# Patient Record
Sex: Male | Born: 2000 | Race: White | Hispanic: No | Marital: Single | Smoking: Never smoker
Health system: Southern US, Community
[De-identification: ages and names within clinical notes are randomized; demographics above are authoritative.]

## PROBLEM LIST (undated history)

## (undated) DIAGNOSIS — J45909 Unspecified asthma, uncomplicated: Secondary | ICD-10-CM

## (undated) HISTORY — PX: ADENOIDECTOMY: SUR15

## (undated) HISTORY — PX: TYMPANOSTOMY TUBE PLACEMENT: SHX32

## (undated) HISTORY — PX: TONSILLECTOMY: SUR1361

---
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
## 2021-02-22 ENCOUNTER — Ambulatory Visit (INDEPENDENT_AMBULATORY_CARE_PROVIDER_SITE_OTHER): Payer: 59

## 2021-02-22 ENCOUNTER — Ambulatory Visit
Admission: EM | Admit: 2021-02-22 | Discharge: 2021-02-22 | Disposition: A | Discharge status: Home / Self Care | Payer: 59 | Attending: Family Medicine | Admitting: Family Medicine

## 2021-02-22 ENCOUNTER — Other Ambulatory Visit: Payer: Self-pay

## 2021-02-22 ENCOUNTER — Encounter: Payer: Self-pay | Admitting: Emergency Medicine

## 2021-02-22 DIAGNOSIS — S62320A Displaced fracture of shaft of second metacarpal bone, right hand, initial encounter for closed fracture: Secondary | ICD-10-CM | POA: Diagnosis not present

## 2021-02-22 MED ORDER — NAPROXEN 500 MG PO TABS: 500.0000 mg | ORAL_TABLET | Freq: Two times a day (BID) | ORAL | 0 refills | Status: AC

## 2021-02-22 NOTE — ED Triage Notes (Signed)
Foul ball hit right hand in glove yesterday.

## 2021-02-22 NOTE — ED Provider Notes (Signed)
[facility]    CSN: [id number] Arrival date & time: 02/22/21  0801      History   Chief Complaint Chief Complaint  Patient presents with   Hand Pain    right    HPI Kenneth Conley is a 20 y.o. male.   Patient presenting today with acute right hand pain, swelling, decreased range of motion below index finger after a fall ball hit his hand inside of the baseball glove last night.  Denies discoloration, numbness, tingling, radiation of pain.  So far has been icing the area and taking over-the-counter pain relievers with minimal relief.  No past history of orthopedic issues to the hand.   Past Medical History:  Diagnosis Date   Asthma     There are no problems to display for this patient.   Past Surgical History:  Procedure Laterality Date   ADENOIDECTOMY     TONSILLECTOMY     TYMPANOSTOMY TUBE PLACEMENT Bilateral        Home Medications    Prior to Admission medications   Medication Sig Start Date End Date Taking? Authorizing Provider  naproxen (NAPROSYN) 500 MG tablet Take 1 tablet (500 mg total) by mouth 2 (two) times daily. 02/22/21  Yes Particia Nearing, PA-C  meloxicam (MOBIC) 7.5 MG tablet Take 1 tablet (7.5 mg total) by mouth daily. Take with food 04/18/18   Ovid Curd P, PA-C  tiZANidine (ZANAFLEX) 4 MG capsule Take 1 capsule (4 mg total) by mouth 3 (three) times daily. 04/18/18   Lutricia Feil, PA-C    Family History Family History  Problem Relation Age of Onset   Healthy Mother    Healthy Father     Social History Social History   Tobacco Use   Smoking status: Never   Smokeless tobacco: Never  Substance Use Topics   Alcohol use: No   Drug use: No     Allergies   Peanut-containing drug products   Review of Systems Review of Systems Per HPI  Physical Exam Triage Vital Signs ED Triage Vitals  Enc Vitals Group     BP 02/22/21 0810 121/60     Pulse Rate 02/22/21 0810 75     Resp 02/22/21 0810 18     Temp  02/22/21 0810 97.9 F (36.6 C)     Temp Source 02/22/21 0810 Oral     SpO2 02/22/21 0810 97 %     Weight 02/22/21 0810 180 lb (81.6 kg)     Height --      Head Circumference --      Peak Flow --      Pain Score 02/22/21 0809 7     Pain Loc --      Pain Edu? --      Excl. in GC? --    No data found.  Updated Vital Signs BP 121/60 (BP Location: Right Arm)   Pulse 75   Temp 97.9 F (36.6 C) (Oral)   Resp 18   Wt 180 lb (81.6 kg)   SpO2 97%   Visual Acuity Right Eye Distance:   Left Eye Distance:   Bilateral Distance:    Right Eye Near:   Left Eye Near:    Bilateral Near:     Physical Exam Vitals and nursing note reviewed.  Constitutional:      Appearance: Normal appearance.  HENT:     Head: Atraumatic.  Eyes:     Extraocular Movements: Extraocular movements intact.  Conjunctiva/sclera: Conjunctivae normal.  Cardiovascular:     Rate and Rhythm: Normal rate and regular rhythm.  Pulmonary:     Effort: Pulmonary effort is normal.     Breath sounds: Normal breath sounds.  Musculoskeletal:        General: Swelling, tenderness and signs of injury present. No deformity. Normal range of motion.     Cervical back: Normal range of motion and neck supple.     Comments: Significant edema focally over right second metacarpal, severely tender to palpation in this area so exam limited.  Good range of motion in 4-5 fingers right hand, sparing the right index finger  Skin:    General: Skin is warm and dry.     Findings: No erythema.  Neurological:     General: No focal deficit present.     Mental Status: He is oriented to person, place, and time.     Comments: Right hand neurovascularly intact  Psychiatric:        Mood and Affect: Mood normal.        Thought Content: Thought content normal.        Judgment: Judgment normal.     UC Treatments / Results  Labs (all labs ordered are listed, but only abnormal results are displayed) Labs Reviewed - No data to  display  EKG   Radiology DG Hand Complete Right  Result Date: 02/22/2021 CLINICAL DATA:  Status post trauma to the right second metacarpal yesterday with pain. EXAM: RIGHT HAND - COMPLETE 3+ VIEW COMPARISON:  October 31, 2014 FINDINGS: Mild displaced fracture of the mid second metacarpal shaft is noted. There is no evidence of arthropathy or other focal bone abnormality. Soft tissues are unremarkable. IMPRESSION: Mild displaced fracture of the mid second metacarpal shaft. Electronically Signed   By: Sherian Rein M.D.   On: 02/22/2021 08:27    Procedures Procedures (including critical care time)  Medications Ordered in UC Medications - No data to display  Initial Impression / Assessment and Plan / UC Course  I have reviewed the triage vital signs and the nursing notes.  Pertinent labs & imaging results that were available during my care of the patient were reviewed by me and considered in my medical decision making (see chart for details).     X-ray right hand showing a mildly displaced fracture of the shaft of the second metacarpal of his right hand.  Radial gutter splint placed today, naproxen sent and RICE recall reviewed.  Follow-up with orthopedics first thing next week.  Return for worsening symptoms at any time.  Final Clinical Impressions(s) / UC Diagnoses   Final diagnoses:  Displaced fracture of shaft of second metacarpal bone, right hand, initial encounter for closed fracture   Discharge Instructions   None    ED Prescriptions     Medication Sig Dispense Auth. Provider   naproxen (NAPROSYN) 500 MG tablet Take 1 tablet (500 mg total) by mouth 2 (two) times daily. 30 tablet Particia Nearing, [place]      PDMP not reviewed this encounter.   Particia Nearing, [place] 02/22/21 [phone]

## 2021-03-31 ENCOUNTER — Encounter: Payer: Self-pay | Admitting: Emergency Medicine

## 2021-03-31 ENCOUNTER — Ambulatory Visit
Admission: EM | Admit: 2021-03-31 | Discharge: 2021-03-31 | Disposition: A | Discharge status: Home / Self Care | Payer: 59 | Attending: Emergency Medicine | Admitting: Emergency Medicine

## 2021-03-31 DIAGNOSIS — B349 Viral infection, unspecified: Secondary | ICD-10-CM | POA: Diagnosis not present

## 2021-03-31 NOTE — ED Triage Notes (Signed)
Pt here with flu-like sx since yesterday.  

## 2021-03-31 NOTE — ED Provider Notes (Signed)
Kenneth Conley    CSN: [id number] Arrival date & time: 03/31/21  1536      History   Chief Complaint Chief Complaint  Patient presents with   Headache   Fever   Sore Throat     HPI Kenneth Conley is a 20 y.o. male.  Patient presents with chills, body aches, sore throat, cough since yesterday.  Treatment at home with ibuprofen.  He denies fever, rash, wheezing, shortness of breath, vomiting, diarrhea, or other symptoms.  His medical history includes asthma.  The history is provided by the patient.   Past Medical History:  Diagnosis Date   Asthma     There are no problems to display for this patient.   Past Surgical History:  Procedure Laterality Date   ADENOIDECTOMY     TONSILLECTOMY     TYMPANOSTOMY TUBE PLACEMENT Bilateral        Home Medications    Prior to Admission medications   Medication Sig Start Date End Date Taking? Authorizing Provider  meloxicam (MOBIC) 7.5 MG tablet Take 1 tablet (7.5 mg total) by mouth daily. Take with food 04/18/18   Ovid Curd P, PA-C  naproxen (NAPROSYN) 500 MG tablet Take 1 tablet (500 mg total) by mouth 2 (two) times daily. 02/22/21   Particia Nearing, PA-C  tiZANidine (ZANAFLEX) 4 MG capsule Take 1 capsule (4 mg total) by mouth 3 (three) times daily. 04/18/18   Lutricia Feil, PA-C    Family History Family History  Problem Relation Age of Onset   Healthy Mother    Healthy Father     Social History Social History   Tobacco Use   Smoking status: Never   Smokeless tobacco: Never  Substance Use Topics   Alcohol use: No   Drug use: No     Allergies   Peanut-containing drug products   Review of Systems Review of Systems  Constitutional:  Positive for chills. Negative for fever.  HENT:  Positive for sore throat. Negative for ear pain.   Respiratory:  Positive for cough. Negative for shortness of breath and wheezing.   Gastrointestinal:  Negative for diarrhea and vomiting.  Skin:   Negative for color change and rash.  All other systems reviewed and are negative.   Physical Exam Triage Vital Signs ED Triage Vitals  Enc Vitals Group     BP      Pulse      Resp      Temp      Temp src      SpO2      Weight      Height      Head Circumference      Peak Flow      Pain Score      Pain Loc      Pain Edu?      Excl. in GC?    No data found.  Updated Vital Signs BP 134/77   Pulse (!) 104   Temp 98.8 F (37.1 C)   Resp 20   SpO2 98%   Visual Acuity Right Eye Distance:   Left Eye Distance:   Bilateral Distance:    Right Eye Near:   Left Eye Near:    Bilateral Near:     Physical Exam Vitals and nursing note reviewed.  Constitutional:      General: He is not in acute distress.    Appearance: He is well-developed.  HENT:     Head: Normocephalic and atraumatic.  Right Ear: Tympanic membrane normal.     Left Ear: Tympanic membrane normal.     Nose: Nose normal.     Mouth/Throat:     Mouth: Mucous membranes are moist.     Pharynx: Oropharynx is clear.  Eyes:     Conjunctiva/sclera: Conjunctivae normal.  Cardiovascular:     Rate and Rhythm: Normal rate and regular rhythm.     Heart sounds: Normal heart sounds.  Pulmonary:     Effort: Pulmonary effort is normal. No respiratory distress.     Breath sounds: Normal breath sounds.  Abdominal:     Palpations: Abdomen is soft.     Tenderness: There is no abdominal tenderness.  Musculoskeletal:     Cervical back: Neck supple.  Skin:    General: Skin is warm and dry.  Neurological:     Mental Status: He is alert.  Psychiatric:        Mood and Affect: Mood normal.        Behavior: Behavior normal.     UC Treatments / Results  Labs (all labs ordered are listed, but only abnormal results are displayed) Labs Reviewed  NOVEL CORONAVIRUS, NAA  POCT INFLUENZA A/B    EKG   Radiology No results found.  Procedures Procedures (including critical care time)  Medications Ordered in  UC Medications - No data to display  Initial Impression / Assessment and Plan / UC Course  I have reviewed the triage vital signs and the nursing notes.  Pertinent labs & imaging results that were available during my care of the patient were reviewed by me and considered in my medical decision making (see chart for details).   Viral illness.  Rapid flu negative.  COVID pending.  Instructed patient to self quarantine per CDC guidelines.  Discussed symptomatic treatment including Tylenol or ibuprofen, rest, hydration.  Instructed patient to follow up with PCP if symptoms are not improving.  Patient agrees to plan of care.    Final Clinical Impressions(s) / UC Diagnoses   Final diagnoses:  Viral illness     Discharge Instructions      Your flu test is negative.  Your COVID test is pending.  You should self quarantine until the test result is back.    Take Tylenol or ibuprofen as needed for fever or discomfort.  Rest and keep yourself hydrated.    Follow-up with your primary care provider if your symptoms are not improving.         ED Prescriptions   None    PDMP not reviewed this encounter.   Mickie Bail, NP 03/31/21 1747

## 2021-03-31 NOTE — Discharge Instructions (Addendum)
Your flu test is negative.  Your COVID test is pending.  You should self quarantine until the test result is back.   ° °Take Tylenol or ibuprofen as needed for fever or discomfort.  Rest and keep yourself hydrated.   ° °Follow-up with your primary care provider if your symptoms are not improving.   ° ° °

## 2021-04-01 LAB — NOVEL CORONAVIRUS, NAA: SARS-CoV-2, NAA: NOT DETECTED

## 2021-04-01 LAB — SARS-COV-2, NAA 2 DAY TAT

## 2022-02-02 IMAGING — DX DG HAND COMPLETE 3+V*R*
3 series · 3 of 3 positions shown · non-contrast
Comparison: October 31, 2014

CLINICAL DATA: Status post trauma to the right second metacarpal
yesterday with pain.

EXAM:
RIGHT HAND - COMPLETE 3+ VIEW

[hand pa]
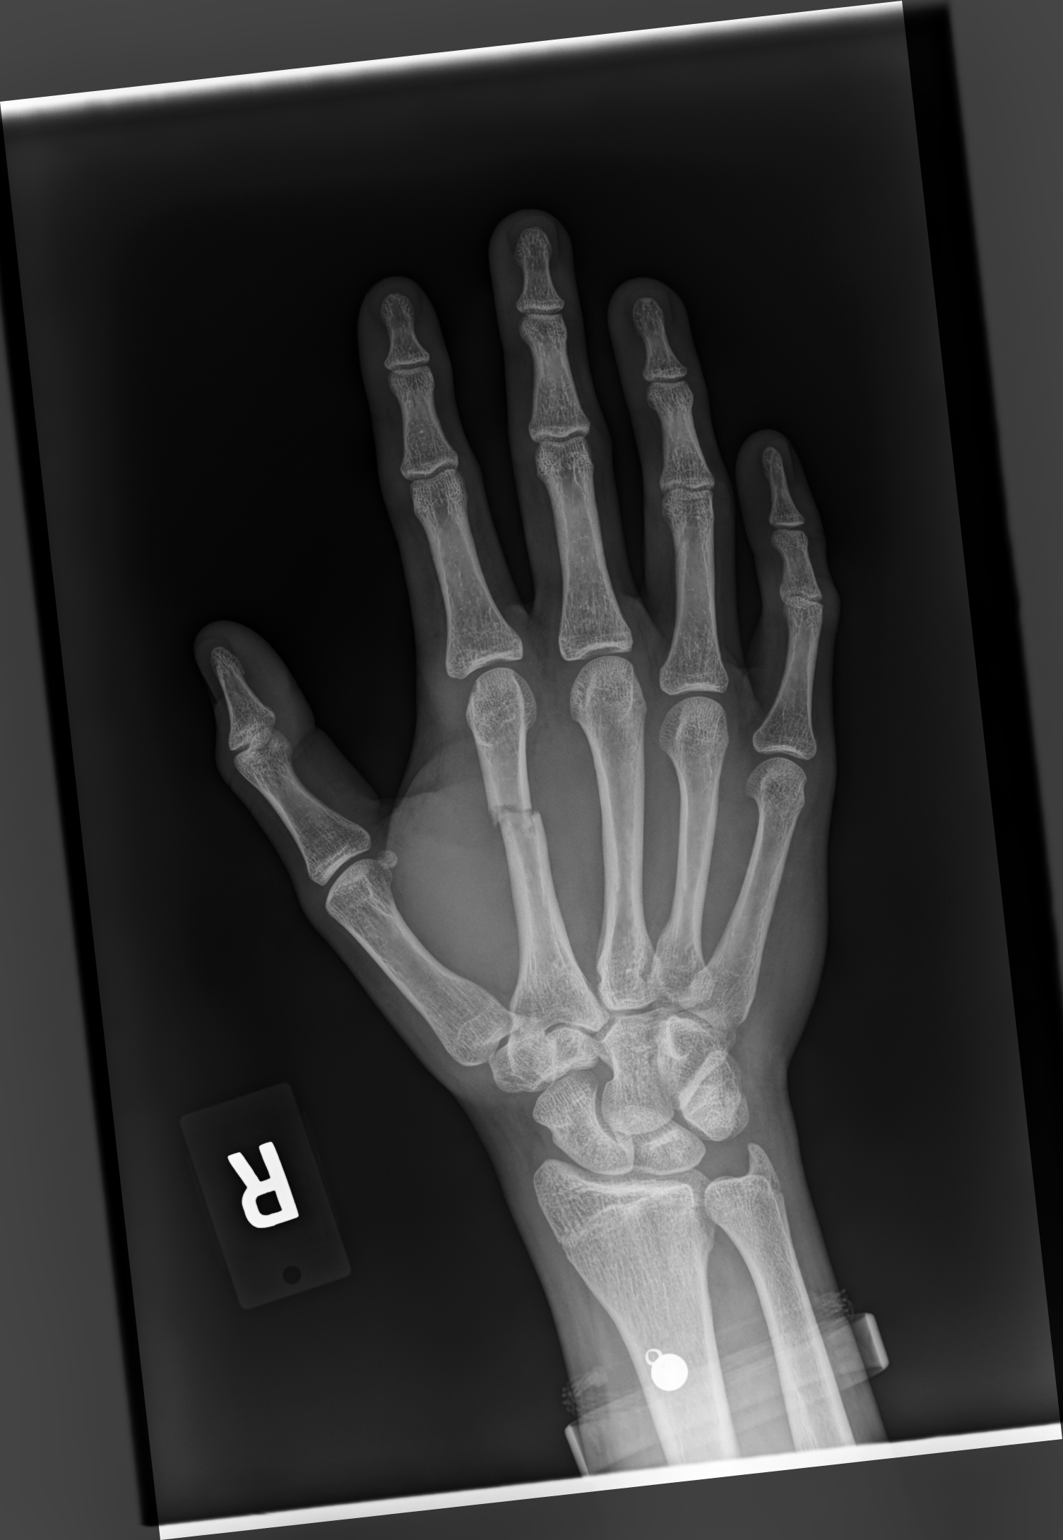

[hand mlo]
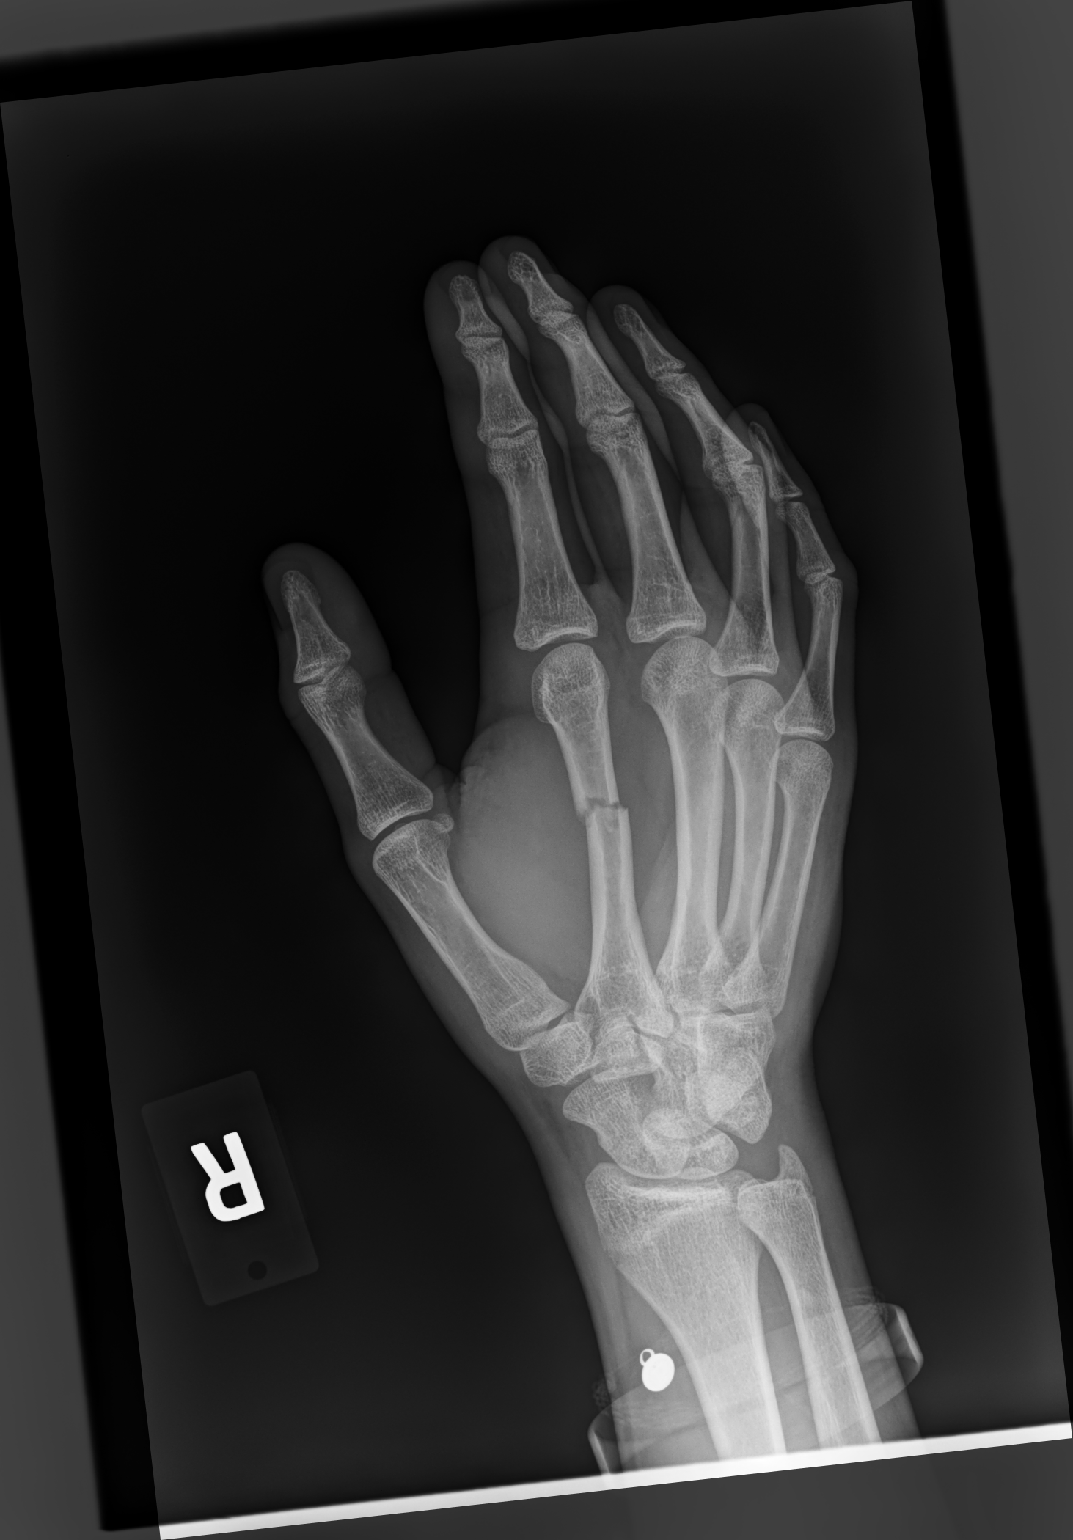

[hand lat]
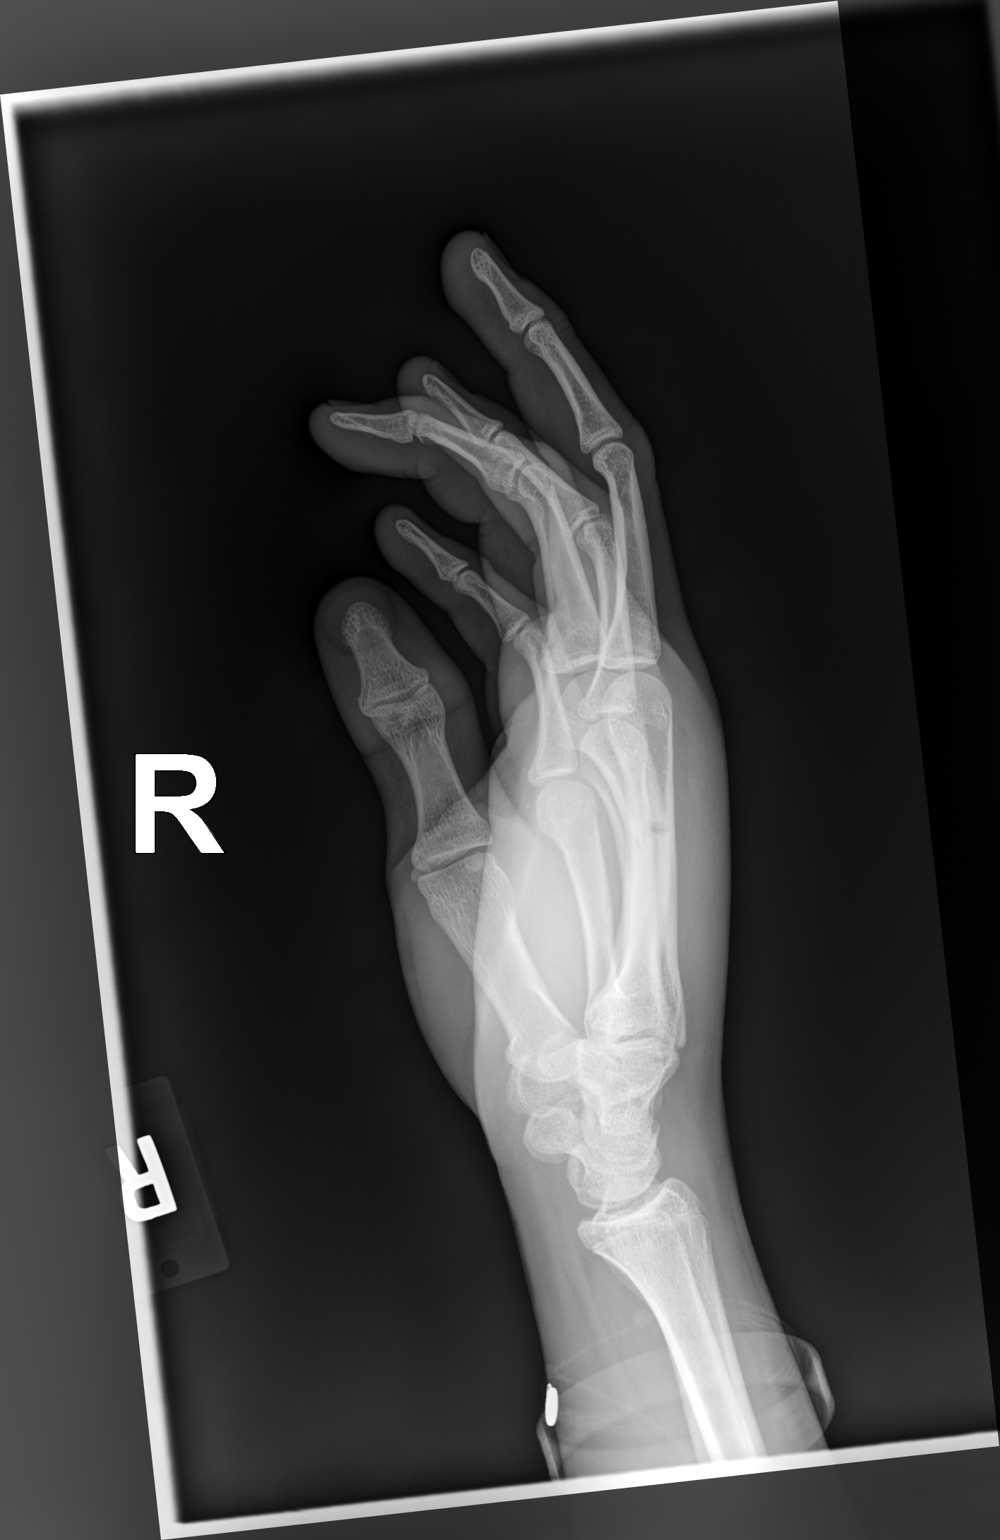

[3 of 3 positions shown; findings below may reference images not displayed]

FINDINGS: Mild displaced fracture of the mid second metacarpal shaft is noted.
There is no evidence of arthropathy or other focal bone abnormality.
Soft tissues are unremarkable.
IMPRESSION: Mild displaced fracture of the mid second metacarpal shaft.

## 2022-05-03 ENCOUNTER — Ambulatory Visit
Admission: EM | Admit: 2022-05-03 | Discharge: 2022-05-03 | Disposition: A | Discharge status: Home / Self Care | Payer: 59 | Attending: Urgent Care | Admitting: Urgent Care

## 2022-05-03 DIAGNOSIS — T781XXA Other adverse food reactions, not elsewhere classified, initial encounter: Secondary | ICD-10-CM | POA: Diagnosis not present

## 2022-05-03 DIAGNOSIS — Z91010 Allergy to peanuts: Secondary | ICD-10-CM

## 2022-05-03 MED ORDER — DIPHENHYDRAMINE HCL 50 MG PO CAPS
50.0000 mg | ORAL_CAPSULE | Freq: Once | ORAL | Status: AC
  Administered 2022-05-03: 50 mg via ORAL

## 2022-05-03 MED ORDER — DEXAMETHASONE SODIUM PHOSPHATE 10 MG/ML IJ SOLN
10.0000 mg | Freq: Once | INTRAMUSCULAR | Status: AC
  Administered 2022-05-03: 10 mg via INTRAMUSCULAR

## 2022-05-03 NOTE — ED Provider Notes (Signed)
Renaldo Fiddler    CSN: [id number] Arrival date & time: 05/03/22  1253      History   Chief Complaint No chief complaint on file.   HPI Kenneth Conley is a 21 y.o. male.   HPI  Triage by provider.  Patient presents with statement that he possibly came in contact with peanut containing food product.  He has no difficulty breathing but feels sensation in his throat. Past Medical History:  Diagnosis Date   Asthma     There are no problems to display for this patient.   Past Surgical History:  Procedure Laterality Date   ADENOIDECTOMY     TONSILLECTOMY     TYMPANOSTOMY TUBE PLACEMENT Bilateral        Home Medications    Prior to Admission medications   Medication Sig Start Date End Date Taking? Authorizing Provider  meloxicam (MOBIC) 7.5 MG tablet Take 1 tablet (7.5 mg total) by mouth daily. Take with food 04/18/18   Ovid Curd P, PA-C  naproxen (NAPROSYN) 500 MG tablet Take 1 tablet (500 mg total) by mouth 2 (two) times daily. 02/22/21   Particia Nearing, PA-C  tiZANidine (ZANAFLEX) 4 MG capsule Take 1 capsule (4 mg total) by mouth 3 (three) times daily. 04/18/18   Lutricia Feil, PA-C    Family History Family History  Problem Relation Age of Onset   Healthy Mother    Healthy Father     Social History Social History   Tobacco Use   Smoking status: Never   Smokeless tobacco: Never  Substance Use Topics   Alcohol use: No   Drug use: No     Allergies   Peanut-containing drug products   Review of Systems Review of Systems   Physical Exam Triage Vital Signs ED Triage Vitals  Enc Vitals Group     BP      Pulse      Resp      Temp      Temp src      SpO2      Weight      Height      Head Circumference      Peak Flow      Pain Score      Pain Loc      Pain Edu?      Excl. in GC?    No data found.  Updated Vital Signs There were no vitals taken for this visit.  Visual Acuity Right Eye Distance:   Left Eye  Distance:   Bilateral Distance:    Right Eye Near:   Left Eye Near:    Bilateral Near:     Physical Exam Constitutional:      Appearance: Normal appearance.  Cardiovascular:     Rate and Rhythm: Normal rate and regular rhythm.     Pulses: Normal pulses.     Heart sounds: Normal heart sounds.  Pulmonary:     Effort: Pulmonary effort is normal.     Breath sounds: Normal breath sounds.  Skin:    General: Skin is warm and dry.  Neurological:     General: No focal deficit present.     Mental Status: He is alert and oriented to person, place, and time.  Psychiatric:        Mood and Affect: Mood normal.        Behavior: Behavior normal.      UC Treatments / Results  Labs (all labs ordered are listed, but  only abnormal results are displayed) Labs Reviewed - No data to display  EKG   Radiology No results found.  Procedures Procedures (including critical care time)  Medications Ordered in UC Medications  diphenhydrAMINE (BENADRYL) capsule 50 mg (has no administration in time range)    Initial Impression / Assessment and Plan / UC Course  I have reviewed the triage vital signs and the nursing notes.  Pertinent labs & imaging results that were available during my care of the patient were reviewed by me and considered in my medical decision making (see chart for details).   Patient is well-appearing on presentation and put in a exam room immediately.  Diphenhydramine 50 mg capsule was administered p.o. immediately on rooming.  Patient endorses some improvement after taking diphenhydramine.  We discussed ongoing use of diphenhydramine to prevent allergic reaction or single Decadron injection.  He agreed to the latter.  Administered Decadron 10 mg in the left deltoid.   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    Charma Igo, [place] 05/03/22 1316

## 2022-05-03 NOTE — Discharge Instructions (Signed)
Go to emergency room if your symptoms continue or worsen.    Follow up with your primary care provider as needed

## 2022-08-20 DIAGNOSIS — T7840XA Allergy, unspecified, initial encounter: Secondary | ICD-10-CM | POA: Insufficient documentation

## 2022-08-20 DIAGNOSIS — J45909 Unspecified asthma, uncomplicated: Secondary | ICD-10-CM | POA: Insufficient documentation

## 2022-08-20 MED ORDER — FAMOTIDINE 20 MG PO TABS
20.0000 mg | ORAL_TABLET | Freq: Once | ORAL | Status: AC
  Administered 2022-08-20: 20 mg via ORAL
  Filled 2022-08-20: qty 1

## 2022-08-20 MED ORDER — PREDNISONE 20 MG PO TABS
60.0000 mg | ORAL_TABLET | Freq: Once | ORAL | Status: AC
  Administered 2022-08-20: 60 mg via ORAL
  Filled 2022-08-20: qty 3

## 2022-08-20 NOTE — ED Triage Notes (Signed)
Pt states he was eating some wings tonight and began to feel like he was having an allergic reaction. Pt states his mouth starting feeling tingly and he felt nauseous. Pt has know nut allergy. Pt states he took 50mg  benadryl pta. Pt denies difficulty breathing or swallowing.

## 2022-08-21 ENCOUNTER — Emergency Department
Admission: EM | Admit: 2022-08-21 | Discharge: 2022-08-21 | Disposition: A | Discharge status: Home / Self Care | Payer: 59 | Attending: Emergency Medicine | Admitting: Emergency Medicine

## 2022-08-21 DIAGNOSIS — T7840XA Allergy, unspecified, initial encounter: Secondary | ICD-10-CM

## 2022-08-21 MED ORDER — EPINEPHRINE 0.3 MG/0.3ML IJ SOAJ: 0.3000 mg | INTRAMUSCULAR | 10 refills | Status: AC | PRN

## 2022-08-21 NOTE — ED Provider Notes (Signed)
[facility] Provider Note    Event Date/Time   First MD Initiated Contact with Patient 08/21/22 0032     (approximate)   History   Allergic Reaction   HPI  Kenneth Conley is a 22 y.o. male past medical history of asthma and allergies to presents because of concern for allergic reaction.  Around 8 PM patient was eating wings when he started to feel that his heart was racing and that his mouth got very dry and tacky.  Says he had some mild dyspnea.  No hives felt he needed to vomit but did not no diarrhea no throat or tongue swelling or difficulty swallowing.  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  Has never had to use an EpiPen has never had anaphylaxis.  Saw an allergist as a child but none recently.  Currently feels fine.     Past Medical History:  Diagnosis Date   Asthma     There are no problems to display for this patient.    Physical Exam  Triage Vital Signs: ED Triage Vitals  Enc Vitals Group     BP 08/20/22 2139 (!) 155/82     Pulse Rate 08/20/22 2139 79     Resp 08/20/22 2139 18     Temp 08/20/22 2139 97.7 F (36.5 C)     Temp src --      SpO2 08/20/22 2139 98 %     Weight 08/20/22 2140 190 lb (86.2 kg)     Height 08/20/22 2140 6\' 1"  (1.854 m)     Head Circumference --      Peak Flow --      Pain Score 08/20/22 2140 0     Pain Loc --      Pain Edu? --      Excl. in GC? --     Most recent vital signs: Vitals:   08/20/22 2139  BP: (!) 155/82  Pulse: 79  Resp: 18  Temp: 97.7 F (36.5 C)  SpO2: 98%     General: Awake, no distress.  CV:  Good peripheral perfusion.  Resp:  Normal effort.  Lungs are clear no wheezing Abd:  No distention.  Neuro:             Awake, Alert, Oriented x 3  Other:  No urticaria no rashes Normal posterior oropharyngeal exam no edema no stridor tolerating secretions   ED Results / Procedures / Treatments  Labs (all labs ordered are listed, but only abnormal results are displayed) Labs  Reviewed - No data to display   EKG     RADIOLOGY    PROCEDURES:  Critical Care performed: No  Procedures   MEDICATIONS ORDERED IN ED: Medications  predniSONE (DELTASONE) tablet 60 mg (60 mg Oral Given by Other 08/20/22 2343)  famotidine (PEPCID) tablet 20 mg (20 mg Oral Given by Other 08/20/22 2343)     IMPRESSION / MDM / ASSESSMENT AND PLAN / ED COURSE  I reviewed the triage vital signs and the nursing notes.                              Patient's presentation is most consistent with acute, uncomplicated illness.  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  About 5 hours prior to arrival my evaluation patient was  eating wings when he started to feel like his heart was racing and that his mouth was dry and he was somewhat dyspneic and nauseous.  He gave himself Benadryl prior to coming.  Received Pepcid and famotidine in triage.  Has never needed an EpiPen.  Does have history of allergies but no anaphylaxis that he is aware of.  Patient's vitals are notable for mild hypertension otherwise within normal limits.  He looks well on exam he is not having any symptoms currently.  Lung sounds are clear no wheezing no rashes no posterior pharyngeal edema no facial swelling.  Suspect a mild allergic reaction.  Could have been mild anaphylaxis but also could have been partially anxiety related.  Given it has been 5 hours since onset of symptoms he is asymptomatic and did not require epinephrine I do feel comfortable with discharge.  Will prescribe EpiPen.  Encouraged him to follow-up with an allergist given he has had some intermittent allergic reactions in the past with no clear trigger.  We discussed return precautions for symptoms of anaphylaxis.  Recommended Benadryl for any minor symptoms.       FINAL CLINICAL IMPRESSION(S) / ED  DIAGNOSES   Final diagnoses:  Allergic reaction, initial encounter     Rx / DC Orders   ED Discharge Orders          Ordered    EPINEPHrine 0.3 mg/0.3 mL IJ SOAJ injection  As needed        08/21/22 0041             Note:  This document was prepared using Dragon voice recognition software and may include unintentional dictation errors.   Georga Hacking, MD 08/21/22 [phone]

## 2022-08-21 NOTE — ED Notes (Signed)
Pt asymptomatic of any allergic reactions at this time
